# Patient Record
Sex: Female | Born: 1935 | Race: White | Hispanic: No | Marital: Married | Smoking: Former smoker
Health system: Southern US, Community
[De-identification: ages and names within clinical notes are randomized; demographics above are authoritative.]

## PROBLEM LIST (undated history)

## (undated) DIAGNOSIS — E7800 Pure hypercholesterolemia, unspecified: Secondary | ICD-10-CM

## (undated) DIAGNOSIS — F0390 Unspecified dementia without behavioral disturbance: Secondary | ICD-10-CM

---

## 2005-12-31 ENCOUNTER — Emergency Department: Payer: Self-pay | Admitting: Emergency Medicine

## 2005-12-31 ENCOUNTER — Other Ambulatory Visit: Payer: Self-pay

## 2006-04-05 ENCOUNTER — Ambulatory Visit: Payer: Self-pay | Admitting: Anesthesiology

## 2006-04-09 ENCOUNTER — Ambulatory Visit: Payer: Self-pay | Admitting: Anesthesiology

## 2006-05-10 ENCOUNTER — Ambulatory Visit: Payer: Self-pay | Admitting: Anesthesiology

## 2006-06-20 ENCOUNTER — Ambulatory Visit: Payer: Self-pay | Admitting: Anesthesiology

## 2006-07-10 ENCOUNTER — Ambulatory Visit: Payer: Self-pay | Admitting: Anesthesiology

## 2006-08-02 ENCOUNTER — Ambulatory Visit: Payer: Self-pay | Admitting: Family Medicine

## 2006-08-07 ENCOUNTER — Ambulatory Visit: Payer: Self-pay | Admitting: Anesthesiology

## 2006-09-04 ENCOUNTER — Ambulatory Visit: Payer: Self-pay | Admitting: Anesthesiology

## 2006-10-03 ENCOUNTER — Ambulatory Visit: Payer: Self-pay | Admitting: Anesthesiology

## 2006-11-13 ENCOUNTER — Ambulatory Visit: Payer: Self-pay | Admitting: Anesthesiology

## 2006-12-24 ENCOUNTER — Ambulatory Visit: Payer: Self-pay | Admitting: Anesthesiology

## 2007-04-03 ENCOUNTER — Emergency Department: Payer: Self-pay | Admitting: Emergency Medicine

## 2007-05-01 ENCOUNTER — Ambulatory Visit: Payer: Self-pay | Admitting: Anesthesiology

## 2007-05-10 ENCOUNTER — Ambulatory Visit: Payer: Self-pay | Admitting: Neurosurgery

## 2007-05-10 ENCOUNTER — Ambulatory Visit: Payer: Self-pay | Admitting: Unknown Physician Specialty

## 2007-07-05 ENCOUNTER — Ambulatory Visit: Payer: Self-pay | Admitting: Anesthesiology

## 2007-10-07 ENCOUNTER — Ambulatory Visit: Payer: Self-pay | Admitting: Family Medicine

## 2007-10-09 ENCOUNTER — Ambulatory Visit: Payer: Self-pay | Admitting: Anesthesiology

## 2007-10-25 ENCOUNTER — Ambulatory Visit: Payer: Self-pay | Admitting: Anesthesiology

## 2007-11-11 ENCOUNTER — Ambulatory Visit: Payer: Self-pay | Admitting: Anesthesiology

## 2008-05-12 ENCOUNTER — Ambulatory Visit: Payer: Self-pay | Admitting: Anesthesiology

## 2008-06-03 ENCOUNTER — Ambulatory Visit: Payer: Self-pay | Admitting: Anesthesiology

## 2008-07-06 ENCOUNTER — Ambulatory Visit: Payer: Self-pay | Admitting: Unknown Physician Specialty

## 2008-10-22 ENCOUNTER — Emergency Department: Payer: Self-pay | Admitting: Emergency Medicine

## 2008-11-10 ENCOUNTER — Ambulatory Visit: Payer: Self-pay | Admitting: Family Medicine

## 2008-12-09 ENCOUNTER — Ambulatory Visit: Payer: Self-pay | Admitting: Gastroenterology

## 2008-12-16 ENCOUNTER — Ambulatory Visit: Payer: Self-pay | Admitting: Anesthesiology

## 2009-01-18 ENCOUNTER — Ambulatory Visit: Payer: Self-pay | Admitting: Unknown Physician Specialty

## 2009-02-15 ENCOUNTER — Ambulatory Visit: Payer: Self-pay | Admitting: Anesthesiology

## 2011-01-12 ENCOUNTER — Ambulatory Visit: Payer: Self-pay | Admitting: Family Medicine

## 2011-02-06 ENCOUNTER — Other Ambulatory Visit: Payer: Self-pay | Admitting: Neurology

## 2011-02-06 DIAGNOSIS — R413 Other amnesia: Secondary | ICD-10-CM

## 2011-02-13 ENCOUNTER — Ambulatory Visit
Admission: RE | Admit: 2011-02-13 | Discharge: 2011-02-13 | Disposition: A | Discharge status: Home / Self Care | Payer: Medicare PPO | Source: Ambulatory Visit | Attending: Neurology | Admitting: Neurology

## 2011-02-13 DIAGNOSIS — R413 Other amnesia: Secondary | ICD-10-CM

## 2011-04-13 ENCOUNTER — Other Ambulatory Visit: Payer: Self-pay | Admitting: Neurology

## 2011-04-13 DIAGNOSIS — I639 Cerebral infarction, unspecified: Secondary | ICD-10-CM

## 2011-04-18 ENCOUNTER — Ambulatory Visit
Admission: RE | Admit: 2011-04-18 | Discharge: 2011-04-18 | Disposition: A | Discharge status: Home / Self Care | Payer: Medicare PPO | Source: Ambulatory Visit | Attending: Neurology | Admitting: Neurology

## 2011-04-18 DIAGNOSIS — I639 Cerebral infarction, unspecified: Secondary | ICD-10-CM

## 2012-01-16 ENCOUNTER — Ambulatory Visit: Payer: Self-pay | Admitting: Family Medicine

## 2013-01-29 ENCOUNTER — Ambulatory Visit: Payer: Self-pay | Admitting: Family Medicine

## 2014-02-02 ENCOUNTER — Ambulatory Visit: Payer: Self-pay | Admitting: Family Medicine

## 2014-07-27 ENCOUNTER — Emergency Department: Payer: Self-pay | Admitting: Student

## 2014-12-22 ENCOUNTER — Other Ambulatory Visit: Payer: Self-pay | Admitting: Family Medicine

## 2014-12-22 DIAGNOSIS — Z1231 Encounter for screening mammogram for malignant neoplasm of breast: Secondary | ICD-10-CM

## 2015-01-05 ENCOUNTER — Ambulatory Visit: Payer: Medicare PPO

## 2015-07-30 ENCOUNTER — Emergency Department: Payer: Medicare PPO

## 2015-07-30 ENCOUNTER — Emergency Department
Admission: EM | Admit: 2015-07-30 | Discharge: 2015-07-30 | Disposition: A | Discharge status: Home / Self Care | Payer: Medicare PPO | Attending: Emergency Medicine | Admitting: Emergency Medicine

## 2015-07-30 DIAGNOSIS — Z87891 Personal history of nicotine dependence: Secondary | ICD-10-CM | POA: Diagnosis not present

## 2015-07-30 DIAGNOSIS — F0390 Unspecified dementia without behavioral disturbance: Secondary | ICD-10-CM | POA: Diagnosis not present

## 2015-07-30 DIAGNOSIS — M898X1 Other specified disorders of bone, shoulder: Secondary | ICD-10-CM

## 2015-07-30 DIAGNOSIS — M25512 Pain in left shoulder: Secondary | ICD-10-CM | POA: Diagnosis present

## 2015-07-30 HISTORY — DX: Pure hypercholesterolemia, unspecified: E78.00

## 2015-07-30 HISTORY — DX: Unspecified dementia, unspecified severity, without behavioral disturbance, psychotic disturbance, mood disturbance, and anxiety: F03.90

## 2015-07-30 LAB — CBC WITH DIFFERENTIAL/PLATELET
BASOS ABS: 0.1 10*3/uL (ref 0–0.1)
BASOS PCT: 1 %
Eosinophils Absolute: 0.2 10*3/uL (ref 0–0.7)
Eosinophils Relative: 2 %
HEMATOCRIT: 45.4 % (ref 35.0–47.0)
Hemoglobin: 15.6 g/dL (ref 12.0–16.0)
LYMPHS PCT: 25 %
Lymphs Abs: 2.8 10*3/uL (ref 1.0–3.6)
MCH: 29 pg (ref 26.0–34.0)
MCHC: 34.2 g/dL (ref 32.0–36.0)
MCV: 84.8 fL (ref 80.0–100.0)
MONO ABS: 0.9 10*3/uL (ref 0.2–0.9)
Monocytes Relative: 8 %
NEUTROS ABS: 7 10*3/uL — AB (ref 1.4–6.5)
NEUTROS PCT: 64 %
Platelets: 229 10*3/uL (ref 150–440)
RBC: 5.36 MIL/uL — AB (ref 3.80–5.20)
RDW: 13.9 % (ref 11.5–14.5)
WBC: 10.9 10*3/uL (ref 3.6–11.0)

## 2015-07-30 LAB — TROPONIN I: Troponin I: <0.03 ng/mL (ref <0.031)

## 2015-07-30 LAB — COMPREHENSIVE METABOLIC PANEL
ALBUMIN: 4.8 g/dL (ref 3.5–5.0)
ALT: 14 U/L (ref 14–54)
AST: 21 U/L (ref 15–41)
Alkaline Phosphatase: 63 U/L (ref 38–126)
Anion gap: 12 (ref 5–15)
BILIRUBIN TOTAL: 1 mg/dL (ref 0.3–1.2)
BUN: 15 mg/dL (ref 6–20)
CHLORIDE: 103 mmol/L (ref 101–111)
CO2: 20 mmol/L — ABNORMAL LOW (ref 22–32)
CREATININE: 0.84 mg/dL (ref 0.44–1.00)
Calcium: 10.2 mg/dL (ref 8.9–10.3)
GFR calc Af Amer: >60 mL/min (ref >60)
GFR calc non Af Amer: >60 mL/min (ref >60)
GLUCOSE: 140 mg/dL — AB (ref 65–99)
POTASSIUM: 3.1 mmol/L — AB (ref 3.5–5.1)
Sodium: 135 mmol/L (ref 135–145)
TOTAL PROTEIN: 8.1 g/dL (ref 6.5–8.1)

## 2015-07-30 MED ORDER — TRAMADOL HCL 50 MG PO TABS: ORAL_TABLET | ORAL | Status: AC

## 2015-07-30 MED ORDER — TRAMADOL HCL 50 MG PO TABS
100.0000 mg | ORAL_TABLET | Freq: Once | ORAL | Status: AC
  Administered 2015-07-30: 100 mg via ORAL
  Filled 2015-07-30: qty 2

## 2015-07-30 NOTE — ED Notes (Signed)
Patient with left shoulder pain. Denies SHOB, N/V/D or diaphoresis.

## 2015-07-30 NOTE — ED Provider Notes (Signed)
[facility] Emergency Department Provider Note  ____________________________________________  Time seen: Approximately 5:14 AM  I have reviewed the triage vital signs and the nursing notes.   HISTORY  Chief Complaint Shoulder Pain  The patient has mild chronic dementia  HPI Tamara Wagner is a 80 y.o. female who is a generally healthy 80 year old woman with mild chronic dementia and a prior orthopedic injury to her left upper extremity who presents with severe sharp pain in her left shoulder blade.  She is not sure when it started but her husband provides the additional history that it has been present for about a week.  It has been gradual in onset and is severe.  It is worse with movement of the left upper extremity or pressure to the shoulder blade.  Nothing makes the pain better.  She has no memory of any trauma and the husband is not aware of any injury she might have sustained.  She has no chest pain, shortness of breath, abdominal pain, nausea, vomiting, headache, difficulty breathing.   Past Medical History  Diagnosis Date  . Dementia   . Hypercholesterolemia     There are no active problems to display for this patient.   History reviewed. No pertinent past surgical history.  Current Outpatient Rx  Name  Route  Sig  Dispense  Refill  . traMADol (ULTRAM) 50 MG tablet      Take 1-2 tablets by mouth every 6 hours as needed for moderate to severe pain   20 tablet   0     Allergies Review of patient's allergies indicates no known allergies.  No family history on file.  Social History Social History  Substance Use Topics  . Smoking status: Former Games developer  . Smokeless tobacco: None  . Alcohol Use: No    Review of Systems (limited by dementia) Constitutional: No fever/chills Eyes: No visual changes. ENT: No sore throat. Cardiovascular: Denies chest pain. Respiratory: Denies shortness of breath. Gastrointestinal: No abdominal pain.  No  nausea, no vomiting.  No diarrhea.  No constipation. Genitourinary: Negative for dysuria. Musculoskeletal: Severe pain in left scapular region Skin: Negative for rash. Neurological: Negative for headaches, focal weakness or numbness.  10-point ROS otherwise negative.  ____________________________________________   PHYSICAL EXAM:  VITAL SIGNS: ED Triage Vitals  Enc Vitals Group     BP 07/30/15 0329 168/102 mmHg     Pulse Rate 07/30/15 0329 82     Resp 07/30/15 0329 20     Temp 07/30/15 0329 98.2 F (36.8 C)     Temp Source 07/30/15 0329 Oral     SpO2 07/30/15 0329 99 %     Weight 07/30/15 0329 145 lb (65.772 kg)     Height 07/30/15 0329  (1.6 m)     Head Cir --      Peak Flow --      Pain Score 07/30/15 0330 10     Pain Loc --      Pain Edu? --      Excl. in GC? --     Constitutional: Alert and oriented. Well appearing and in no acute distress until she moves Eyes: Conjunctivae are normal. PERRL. EOMI. Head: Atraumatic. Neck: No stridor.  No cervical spine tenderness to palpation. No meningismus. Cardiovascular: Normal rate, regular rhythm. Grossly normal heart sounds.  Good peripheral circulation. Respiratory: Normal respiratory effort.  No retractions. Lungs CTAB. Gastrointestinal: Soft and nontender. No distention.  Musculoskeletal: The patient has full range of motion of  her left upper extremity but moving the arm does cause reproducible pain in her left scapula.  She also has tenderness to palpation of the left scapula.  She has no spinal tenderness.  She does not have allodynia and the pain can be reproduced with deep palpation rather than light touch to the skin. Neurologic:  Normal speech and language. No gross focal neurologic deficits are appreciated.  Skin:  Skin is warm, dry and intact. No rash noted on her back or left side of her ribs. Psychiatric: Mood and affect are normal. Speech and behavior are normal.  ____________________________________________    LABS (all labs ordered are listed, but only abnormal results are displayed)  Labs Reviewed  CBC WITH DIFFERENTIAL/PLATELET - Abnormal; Notable for the following:    RBC 5.36 (*)    Neutro Abs 7.0 (*)    All other components within normal limits  COMPREHENSIVE METABOLIC PANEL - Abnormal; Notable for the following:    Potassium 3.1 (*)    CO2 20 (*)    Glucose, Bld 140 (*)    All other components within normal limits  TROPONIN I   ____________________________________________  EKG  ED ECG REPORT I, Chika Cichowski, the attending physician, personally viewed and interpreted this ECG.  Date: 07/30/2015 EKG Time: 03:29 Rate: 74 Rhythm: normal sinus rhythm QRS Axis: normal Intervals: normal ST/T Wave abnormalities: Non-specific ST segment / T-wave changes, but no evidence of acute ischemia. Conduction Disutrbances: none Narrative Interpretation: unremarkable  ____________________________________________  RADIOLOGY   Dg Shoulder Left  07/30/2015  CLINICAL DATA:  Acute onset of left shoulder pain. Initial encounter. EXAM: LEFT SHOULDER - 2+ VIEW COMPARISON:  Left shoulder radiographs performed 07/27/2014 FINDINGS: There is no evidence of acute fracture or dislocation. Following the prior fracture in 2016, there appears to have been some degree of resorption of the left humeral head, with healing of the humerus in a somewhat displaced fashion. This results in an osseous defect at the expected location of a portion of the humeral head. The left humeral head remains seated within the glenoid fossa. The acromioclavicular joint is unremarkable in appearance. No significant soft tissue abnormalities are seen. The visualized portions of the left lung are clear. IMPRESSION: No evidence of acute fracture or dislocation. Chronic deformity of the proximal left humerus reflects the prior fracture and subsequent partial resorption of the left humeral head. Electronically Signed   By: Roanna Raider  M.D.   On: 07/30/2015 04:46    ____________________________________________   PROCEDURES  Procedure(s) performed: None  Critical Care performed: No ____________________________________________   INITIAL IMPRESSION / ASSESSMENT AND PLAN / ED COURSE  Pertinent labs & imaging results that were available during my care of the patient were reviewed by me and considered in my medical decision making (see chart for details).  Her workup is reassuring and her chest x-ray is unremarkable.  This may be acute on chronic pain associated with a prior injury.  Its highly reproducible nature suggests musculoskeletal strain.  There is nothing to suggest a different cause such as ACS, aortic pathology, etc.  I advised on 2 separate occasions to the patient and her husband that they follow up with Dr. Hyacinth Meeker, whom she saw in the past for her prior fracture.  I gave my usual and customary return precautions.     ____________________________________________  FINAL CLINICAL IMPRESSION(S) / ED DIAGNOSES  Final diagnoses:  Pain of left scapula      NEW MEDICATIONS STARTED DURING THIS VISIT:  New Prescriptions  TRAMADOL (ULTRAM) 50 MG TABLET    Take 1-2 tablets by mouth every 6 hours as needed for moderate to severe pain     Loleta Rose, MD 07/30/15 [phone]

## 2015-07-30 NOTE — ED Notes (Signed)
Patient transported to X-ray 

## 2015-07-30 NOTE — Discharge Instructions (Signed)

## 2015-07-30 NOTE — ED Notes (Signed)
MD at bedside. 

## 2015-07-30 NOTE — ED Notes (Signed)
Lab called to add on blood work orders

## 2015-12-20 IMAGING — CR DG SHOULDER 3+V*L*
1 series · 4 of 4 positions shown · non-contrast
Comparison: Left rib radiographs performed 10/22/2008

CLINICAL DATA: Status post fall; injury to left shoulder. Initial
encounter.

EXAM:
DG SHOULDER 3+VIEWS LEFT

[Series 1: w shoulder grashey left · 0.14mm/px · 4 of 4 slices shown]
[im 1/4]
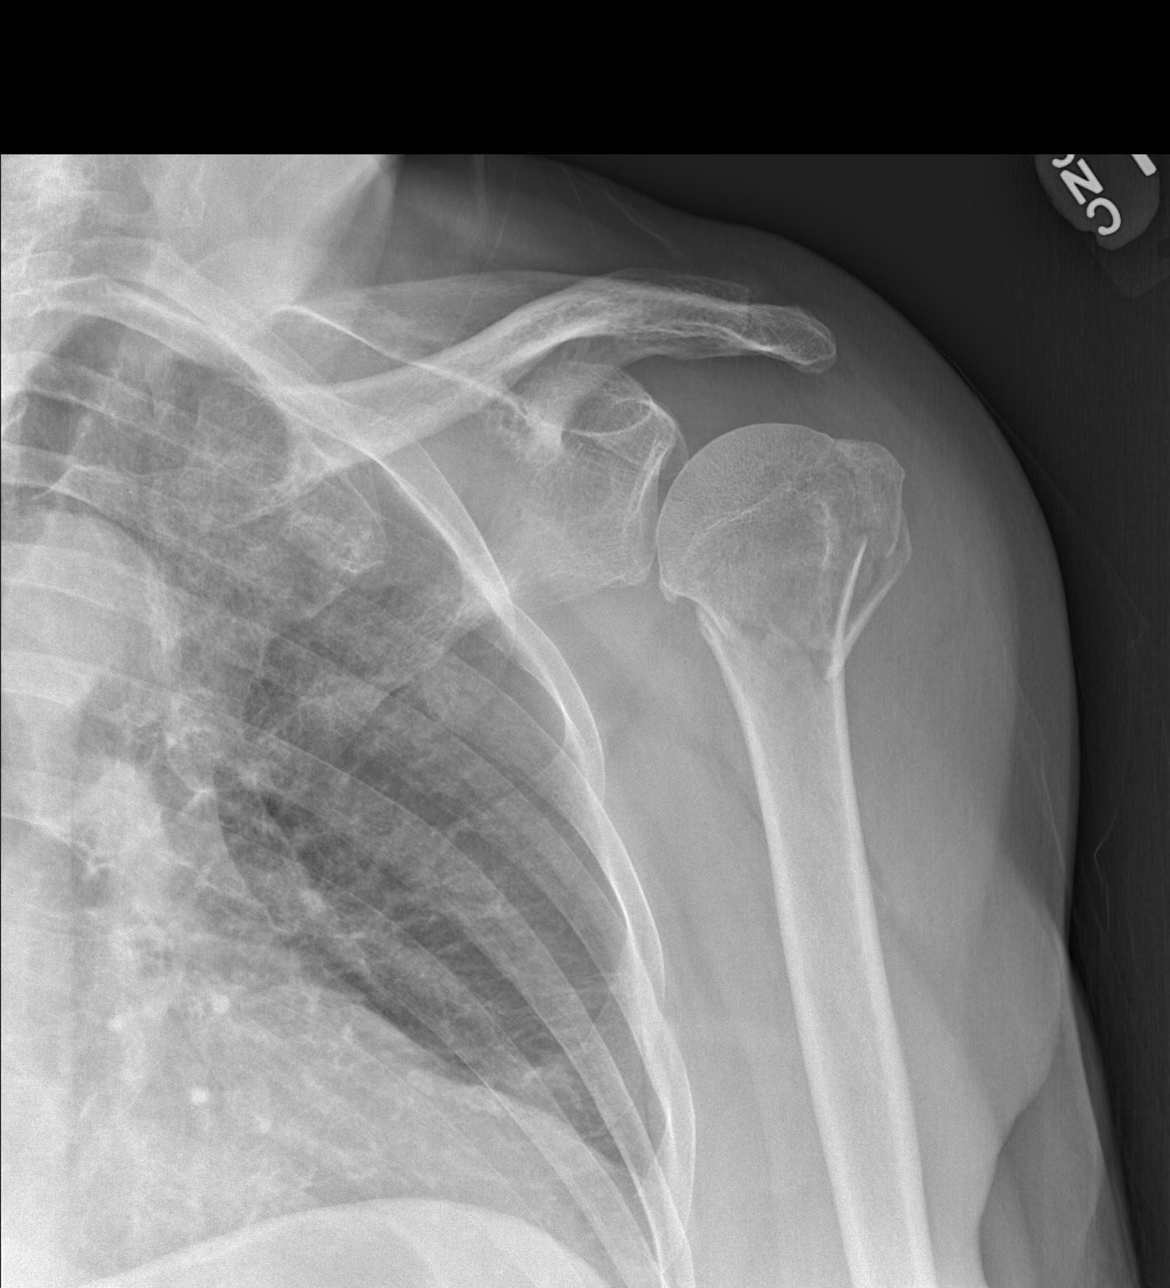
[im 2/4]
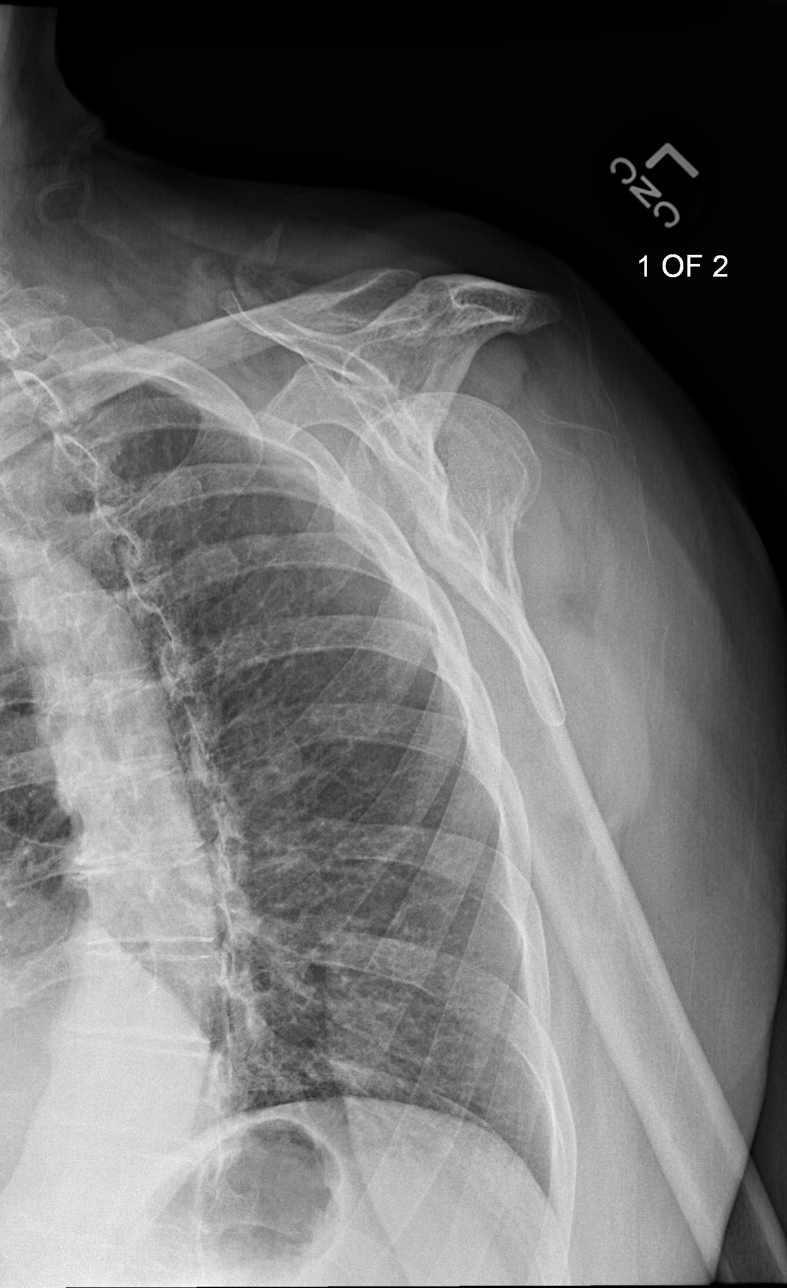
[im 3/4]
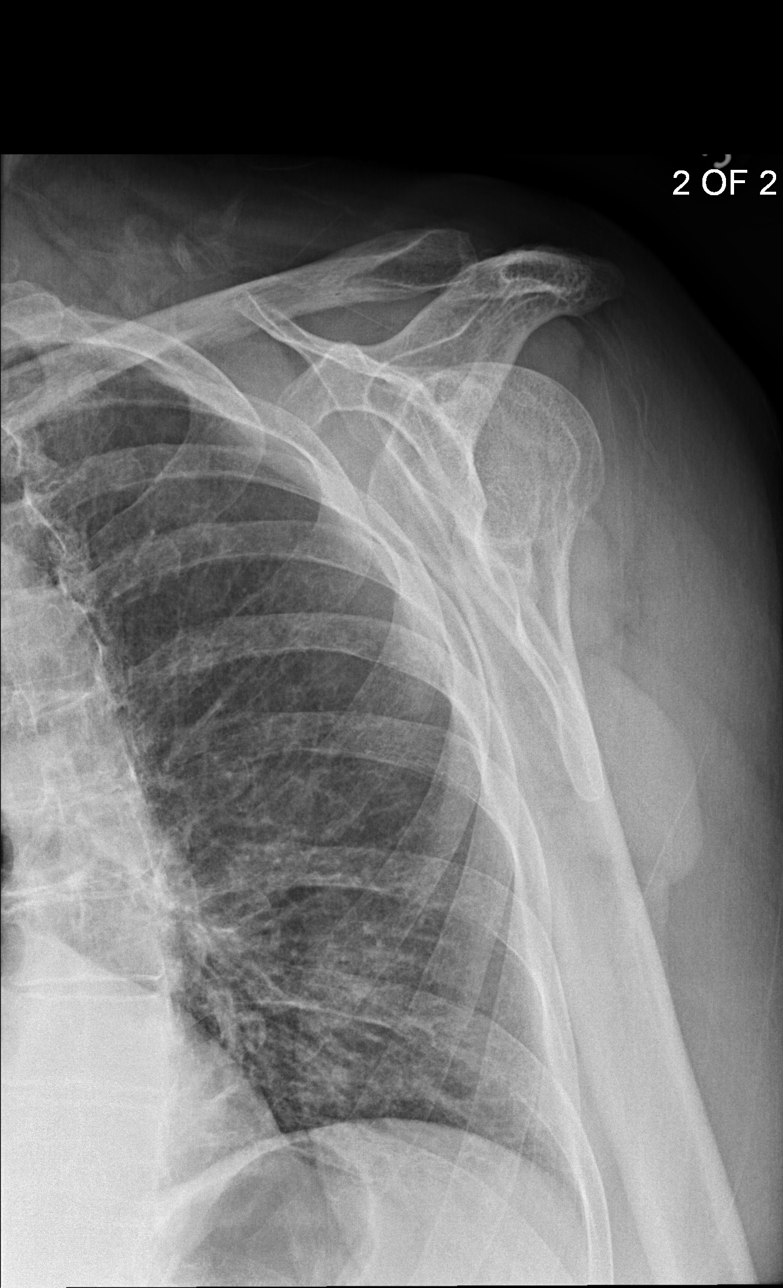
[im 4/4]
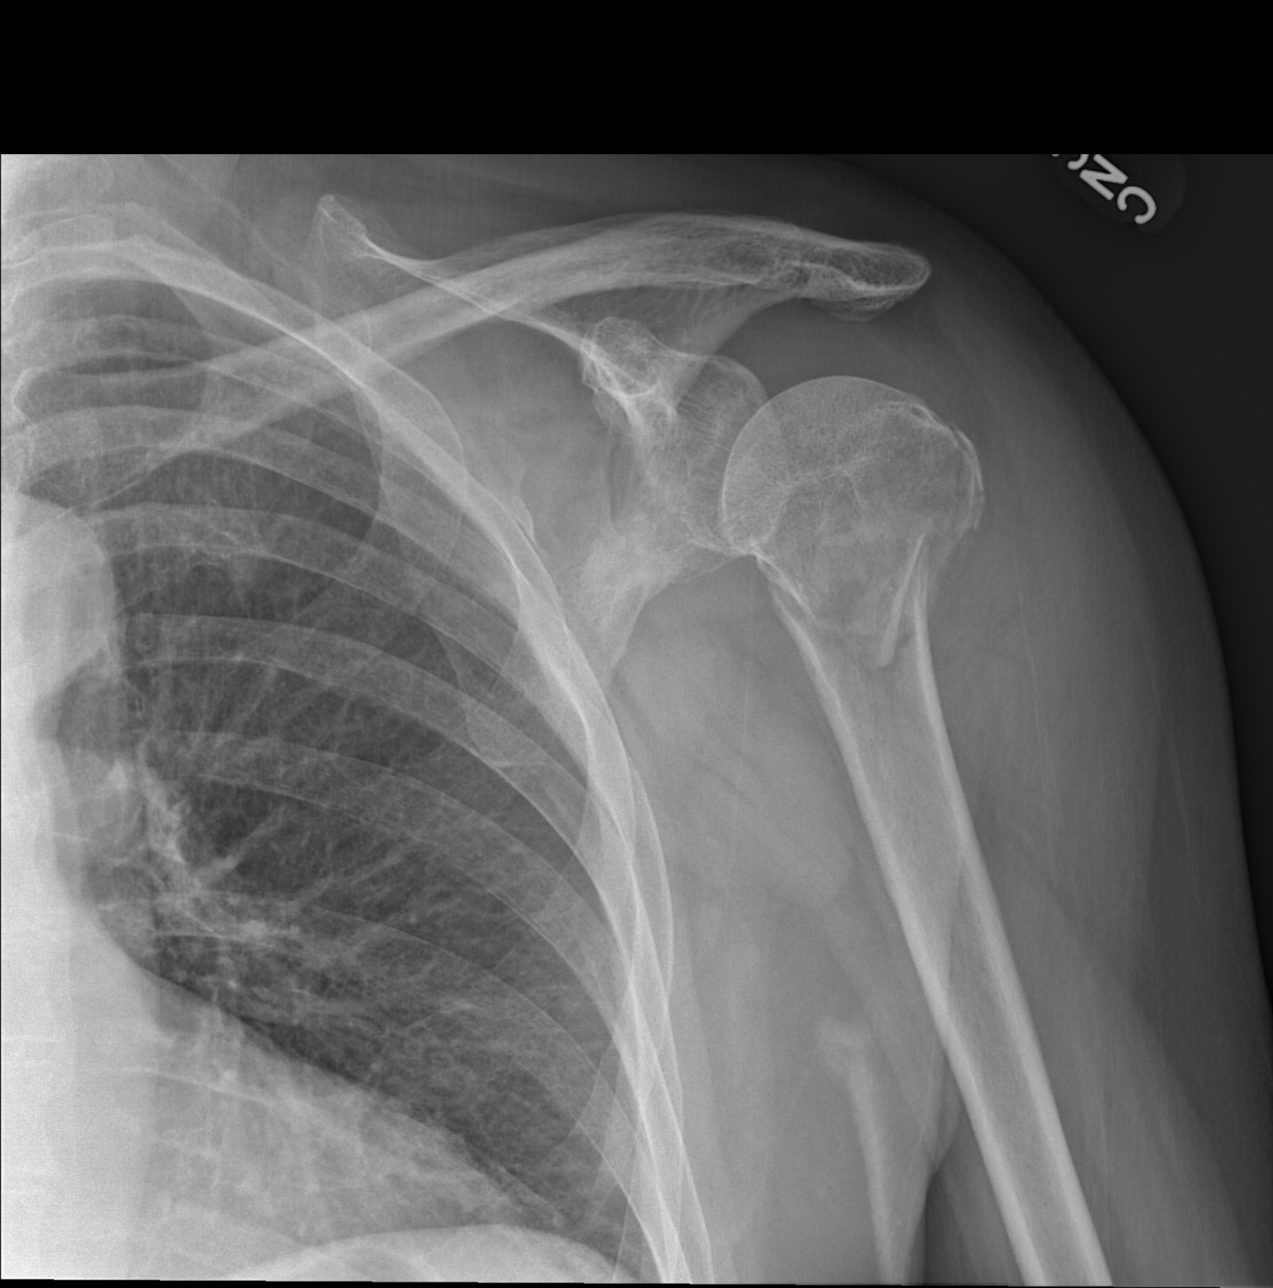

[4 of 4 positions shown; findings below may reference images not displayed]

FINDINGS: There is a comminuted fracture involving the surgical neck of the
humerus, extending to the lateral aspect of the left humeral head. A
nondisplaced greater tuberosity fragment is noted. There is slight
medial displacement of the distal humerus. The left humeral head
remains seated at the glenoid fossa.

The left acromioclavicular joint is unremarkable. No additional
fractures are seen. The visualized portions of the lungs are grossly
clear.
IMPRESSION: Comminuted fracture involving the surgical neck of the humerus,
extending to the lateral aspect of the left humeral head.
Nondisplaced greater tuberosity fragment noted. Slight medial
displacement of the distal humerus.

## 2015-12-20 IMAGING — CT CT HEAD WITHOUT CONTRAST
1 series · 16 of 30 positions shown, 20 images · non-contrast
Comparison: None.

CLINICAL DATA: Altered mental status. Fall. Fall today. LEFT
shoulder injury.

EXAM:
CT HEAD WITHOUT CONTRAST
TECHNIQUE: Contiguous axial images were obtained from the base of the skull
through the vertex without intravenous contrast.

[Series 2: head wo · axial · 0.43mm/px · z∈[+423,+580]mm · 16 of 36 slices shown, 20 images]
[im 2/36  brain]
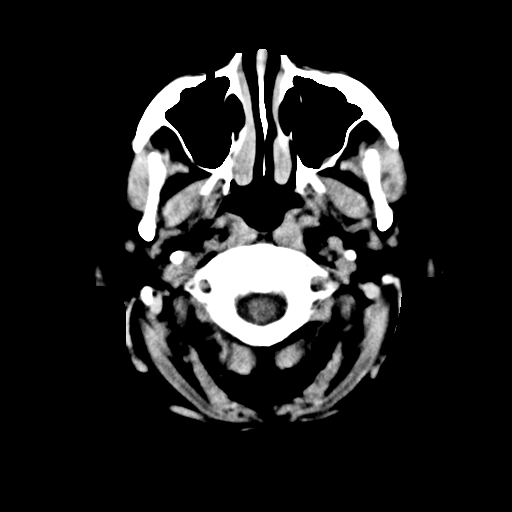
[im 2/36  bone]
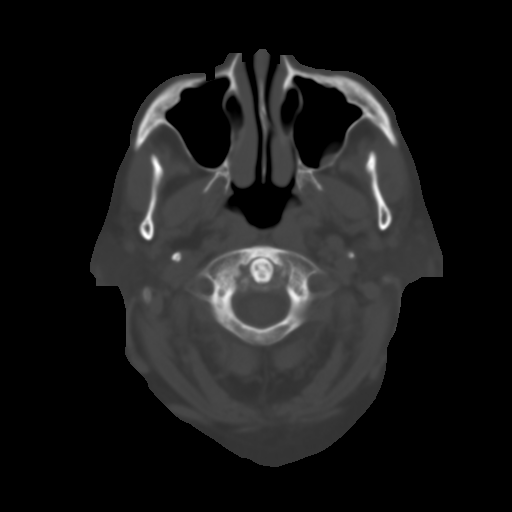
[im 4/36  brain]
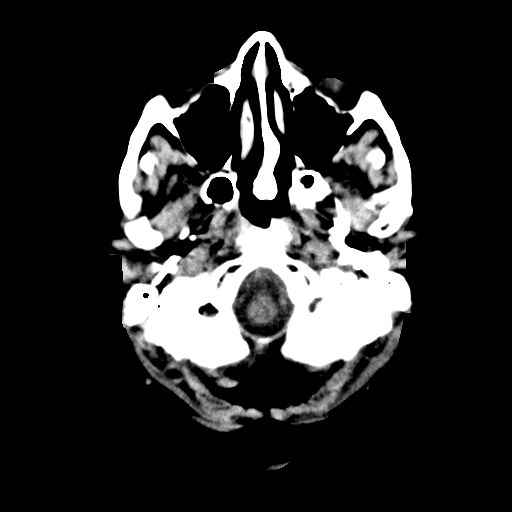
[im 7/36  brain]
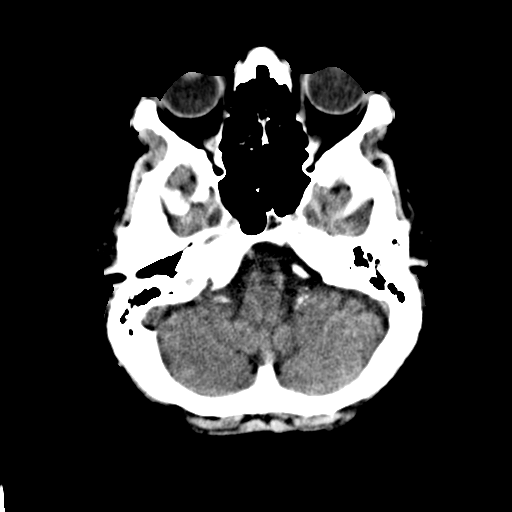
[im 9/36  brain]
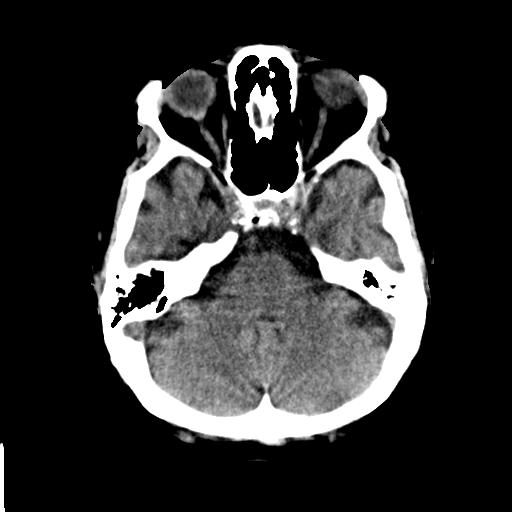
[im 10/36  brain]
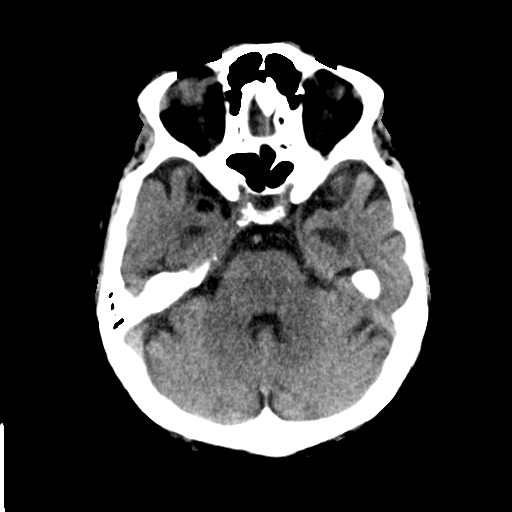
[im 10/36  bone]
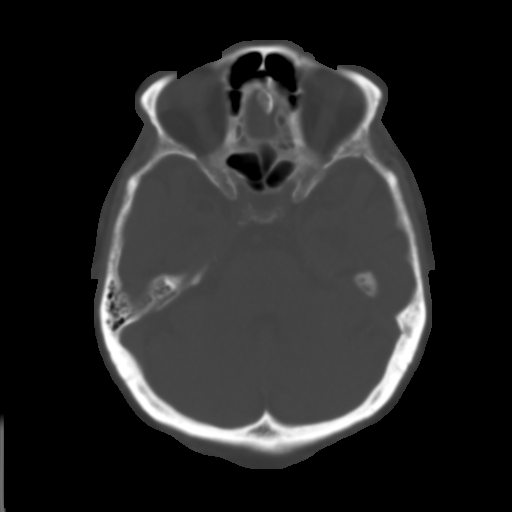
[im 13/36  brain]
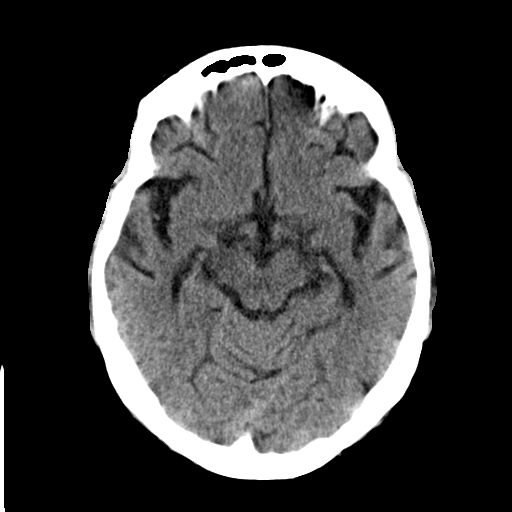
[im 15/36  brain]
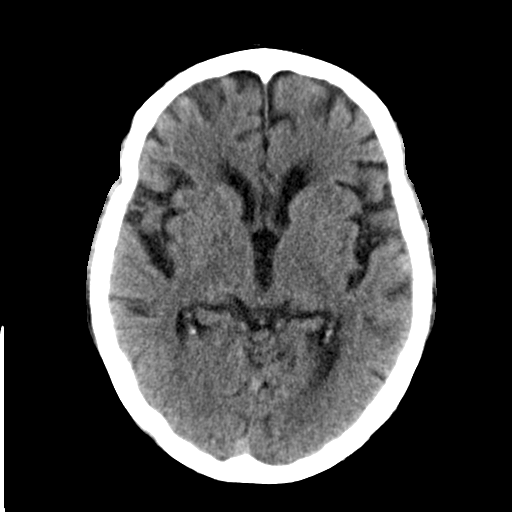
[im 17/36  brain]
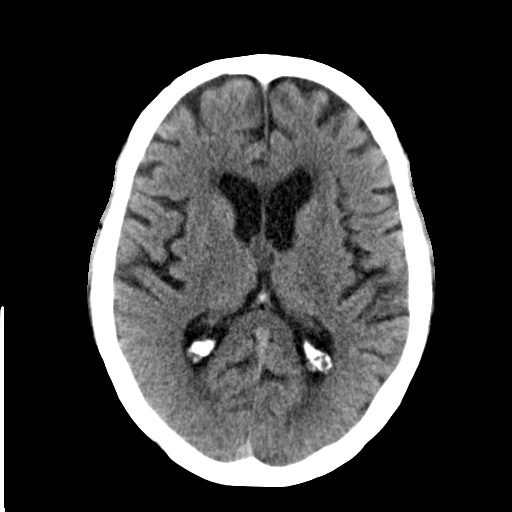
[im 19/36  brain]
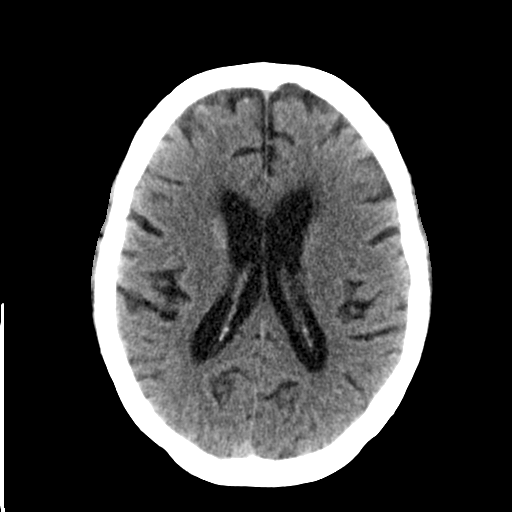
[im 19/36  bone]
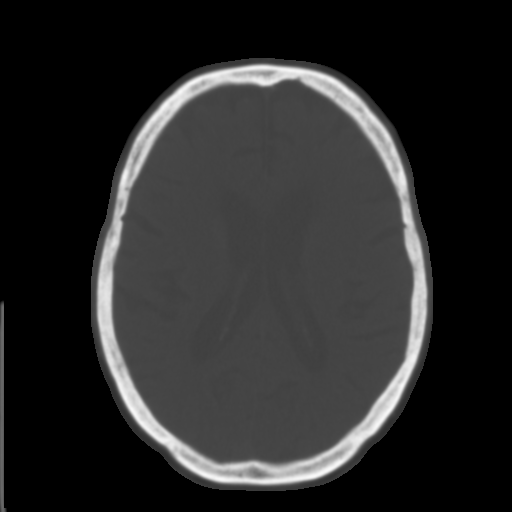
[im 21/36  brain]
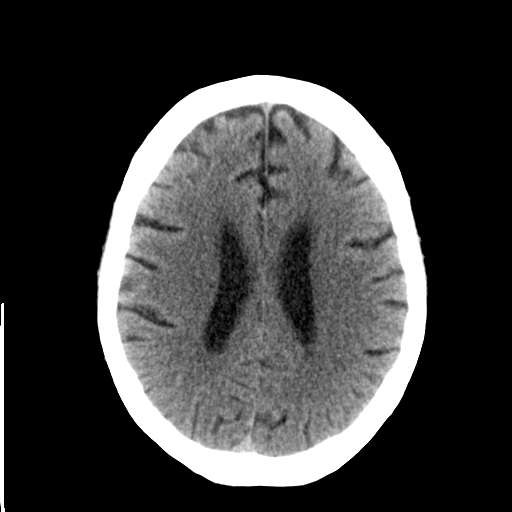
[im 23/36  brain]
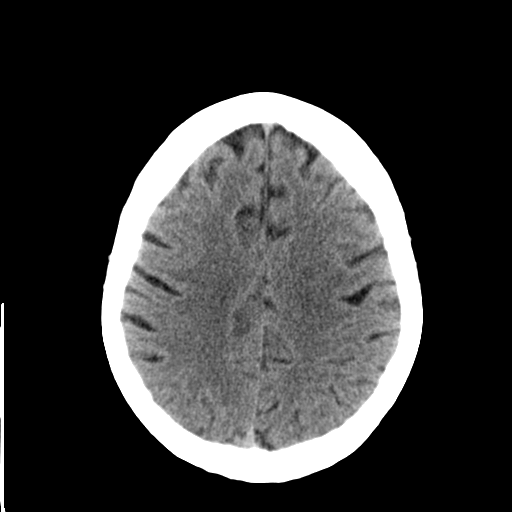
[im 26/36  brain]
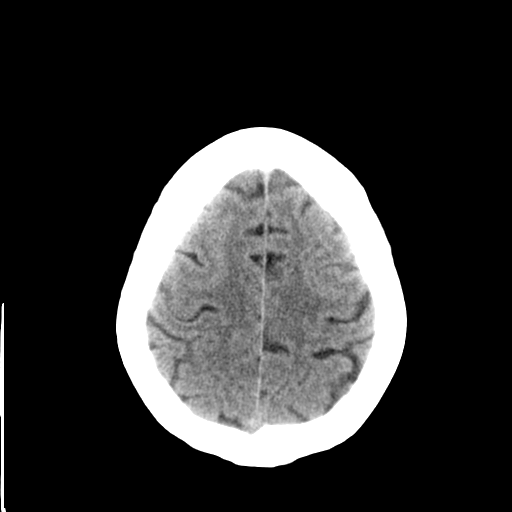
[im 27/36  brain]
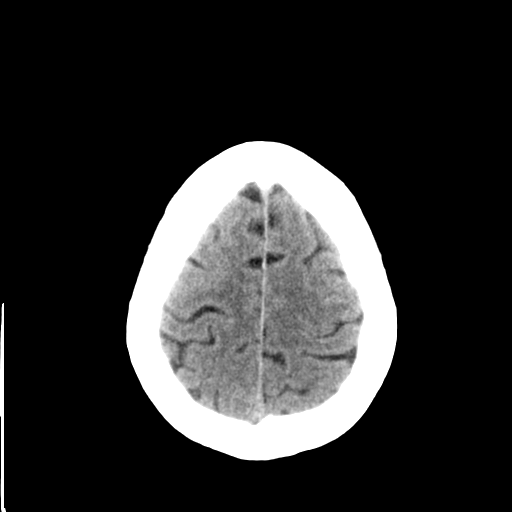
[im 27/36  bone]
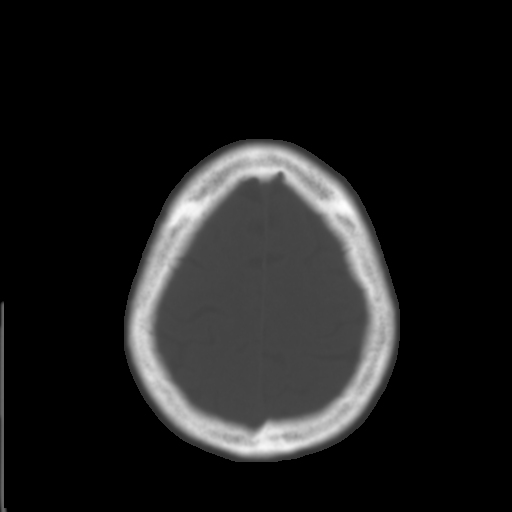
[im 29/36  brain]
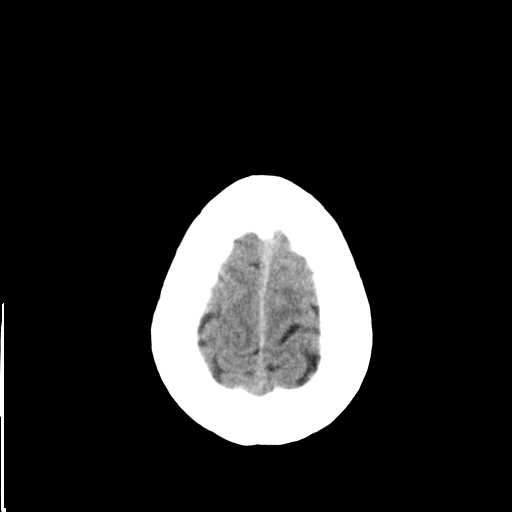
[im 32/36  brain]
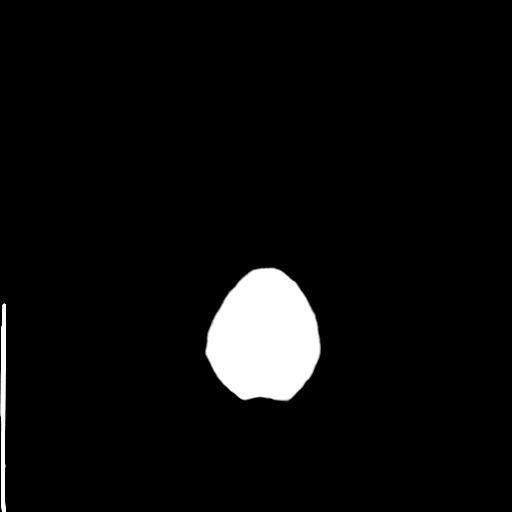
[im 34/36  brain]
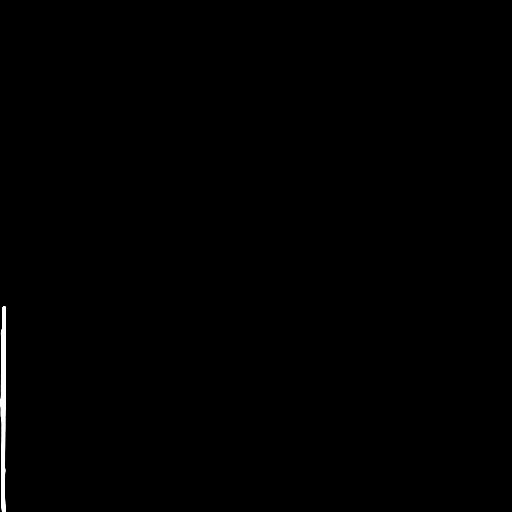

[16 of 30 positions shown; findings below may reference images not displayed]

FINDINGS: No mass lesion, mass effect, midline shift, hydrocephalus,
hemorrhage. No acute territorial cortical ischemia/infarct. Atrophy
and chronic ischemic white matter disease is present. Atrophy is age
appropriate. Calcification of the cruciform ligament of the atlas
incidentally noted. There is no skull fracture. Mild intracranial
atherosclerosis. The paranasal sinuses are within normal limits.
Calvarium intact.
IMPRESSION: Age-appropriate atrophy and chronic ischemic white matter disease.
No acute intracranial abnormality.

## 2016-12-22 IMAGING — CR DG SHOULDER 2+V*L*
1 series · 3 of 3 positions shown · non-contrast
Comparison: Left shoulder radiographs performed 07/27/2014

CLINICAL DATA: Acute onset of left shoulder pain. Initial
encounter.

EXAM:
LEFT SHOULDER - 2+ VIEW

[Series 1: dg shoulder left · 0.14mm/px · 3 of 3 slices shown]
[im 1/3]
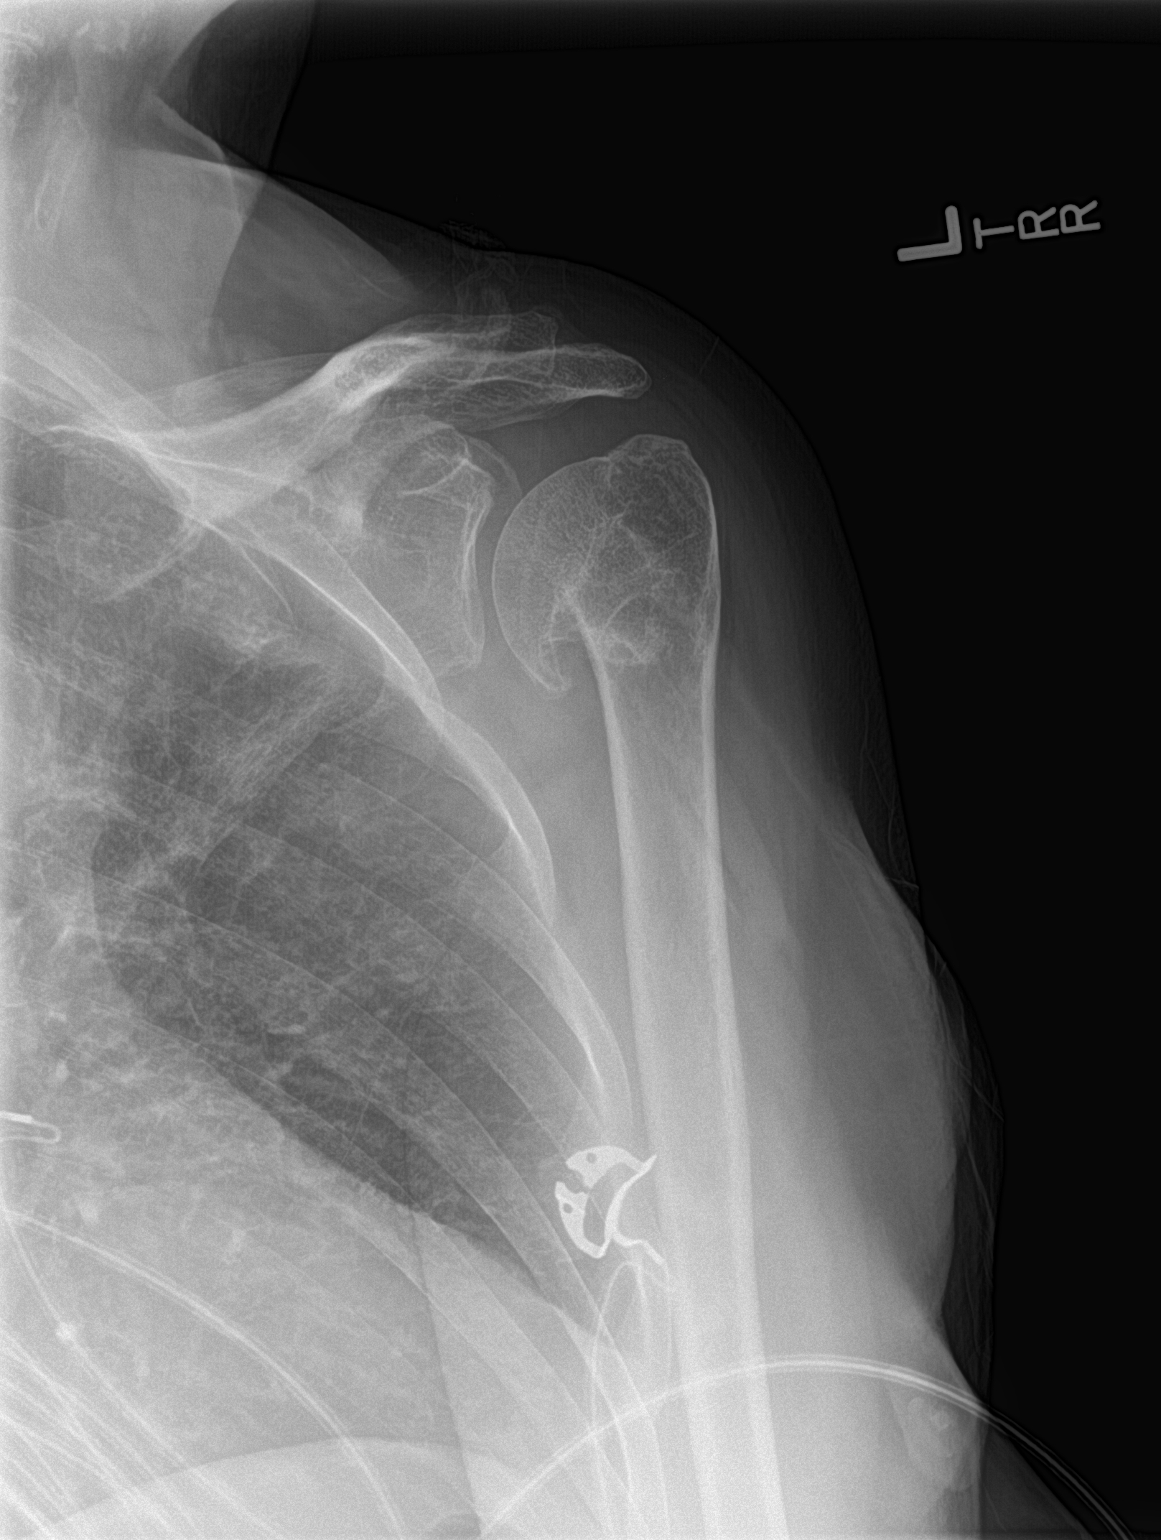
[im 2/3]
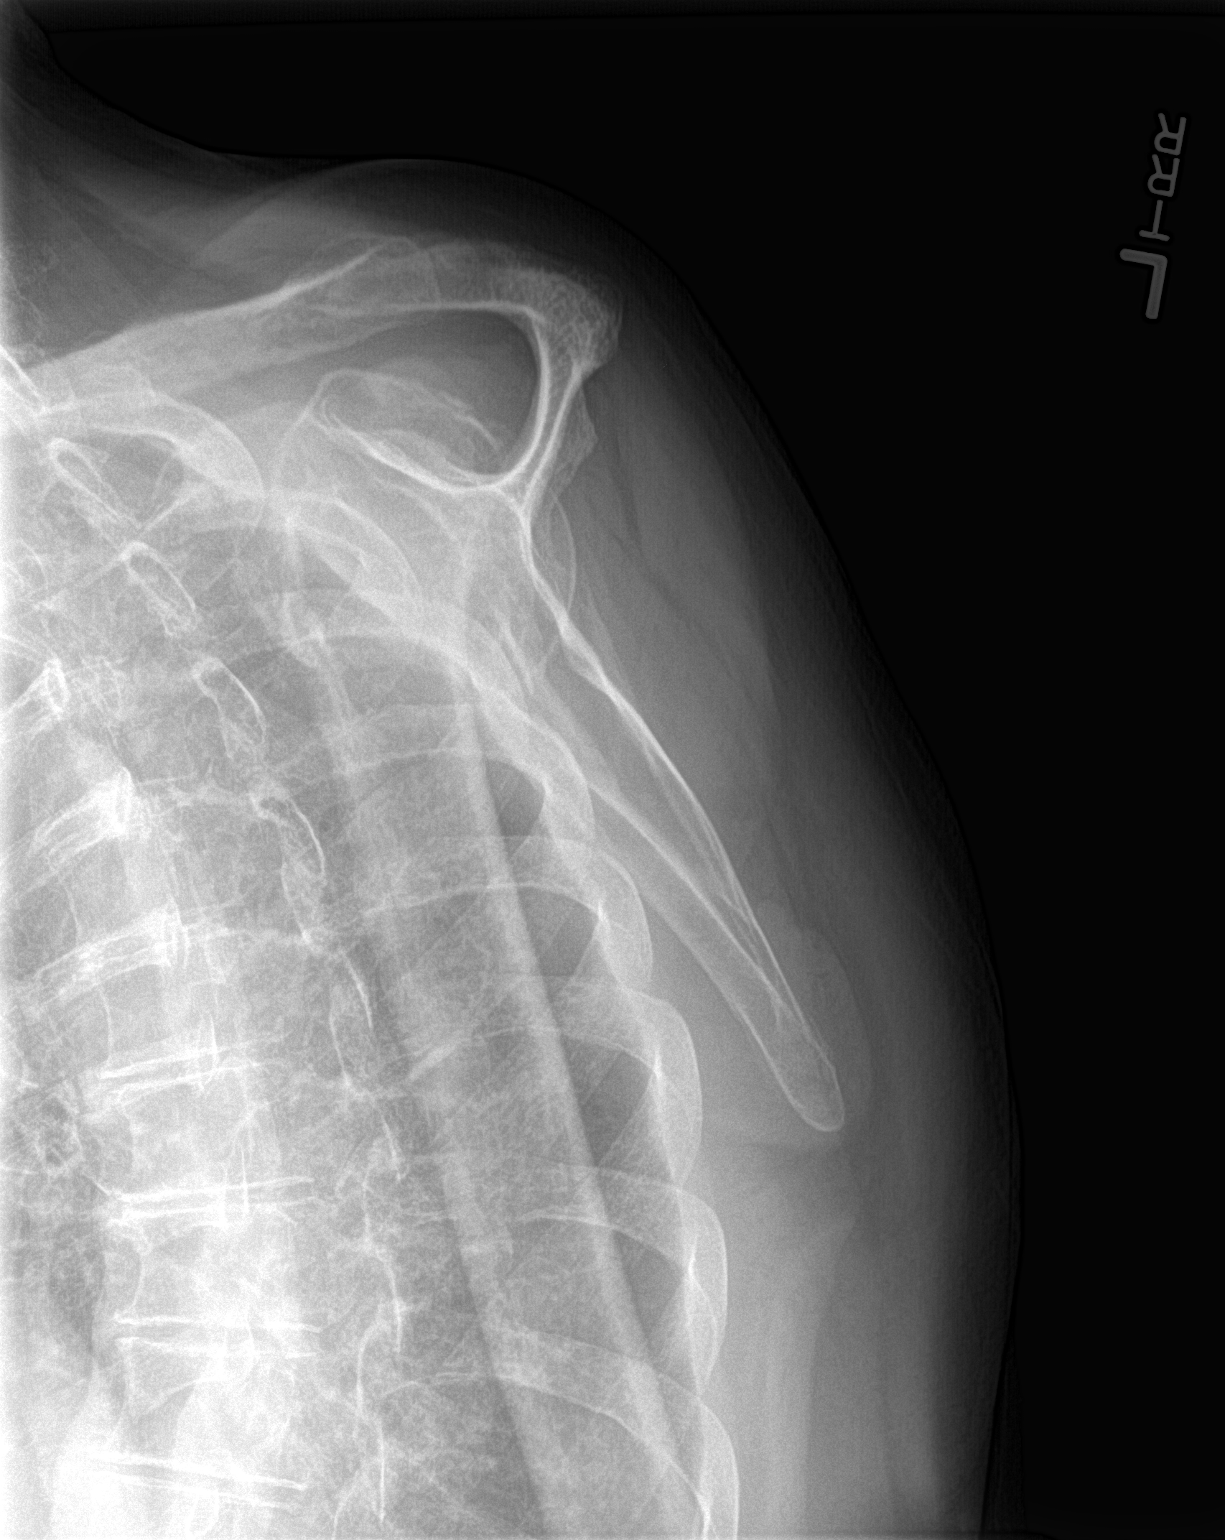
[im 3/3]
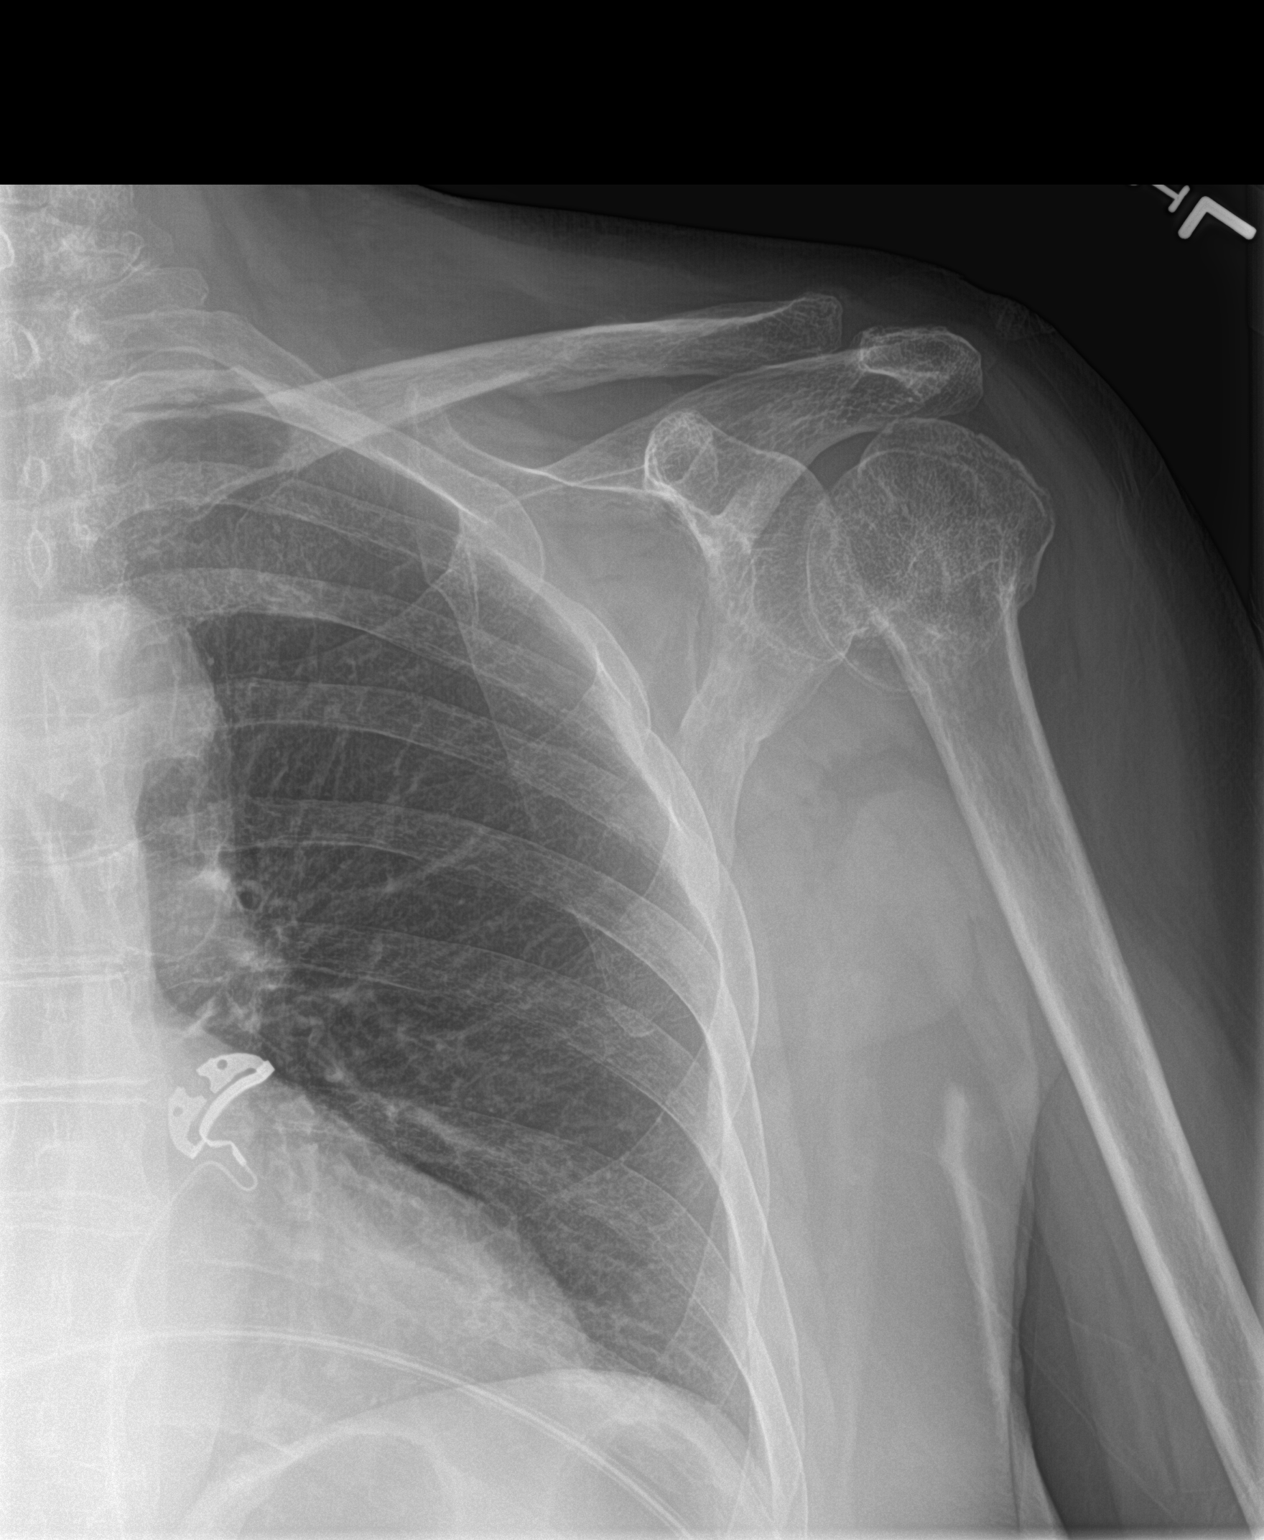

[3 of 3 positions shown; findings below may reference images not displayed]

FINDINGS: There is no evidence of acute fracture or dislocation. Following the
prior fracture in 1419, there appears to have been some degree of
resorption of the left humeral head, with healing of the humerus in
a somewhat displaced fashion. This results in an osseous defect at
the expected location of a portion of the humeral head.

The left humeral head remains seated within the glenoid fossa. The
acromioclavicular joint is unremarkable in appearance. No
significant soft tissue abnormalities are seen. The visualized
portions of the left lung are clear.
IMPRESSION: No evidence of acute fracture or dislocation. Chronic deformity of
the proximal left humerus reflects the prior fracture and subsequent
partial resorption of the left humeral head.

## 2019-02-01 DEATH — deceased
